# Patient Record
Sex: Male | Born: 1990 | Race: Black or African American | Hispanic: No | Marital: Single | Smoking: Current every day smoker
Health system: Southern US, Community
[De-identification: ages and names within clinical notes are randomized; demographics above are authoritative.]

## PROBLEM LIST (undated history)

## (undated) DIAGNOSIS — S42009A Fracture of unspecified part of unspecified clavicle, initial encounter for closed fracture: Secondary | ICD-10-CM

---
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
## 2016-03-16 ENCOUNTER — Emergency Department (HOSPITAL_COMMUNITY): Payer: Self-pay

## 2016-03-16 ENCOUNTER — Encounter (HOSPITAL_COMMUNITY): Payer: Self-pay | Admitting: Emergency Medicine

## 2016-03-16 ENCOUNTER — Emergency Department (HOSPITAL_COMMUNITY)
Admission: EM | Admit: 2016-03-16 | Discharge: 2016-03-16 | Disposition: A | Discharge status: Home / Self Care | Payer: Self-pay | Attending: Emergency Medicine | Admitting: Emergency Medicine

## 2016-03-16 DIAGNOSIS — F17210 Nicotine dependence, cigarettes, uncomplicated: Secondary | ICD-10-CM | POA: Insufficient documentation

## 2016-03-16 DIAGNOSIS — N50819 Testicular pain, unspecified: Secondary | ICD-10-CM

## 2016-03-16 DIAGNOSIS — R1030 Lower abdominal pain, unspecified: Secondary | ICD-10-CM

## 2016-03-16 DIAGNOSIS — R1031 Right lower quadrant pain: Secondary | ICD-10-CM | POA: Insufficient documentation

## 2016-03-16 DIAGNOSIS — N451 Epididymitis: Secondary | ICD-10-CM

## 2016-03-16 HISTORY — DX: Fracture of unspecified part of unspecified clavicle, initial encounter for closed fracture: S42.009A

## 2016-03-16 LAB — CBC WITH DIFFERENTIAL/PLATELET
BASOS PCT: 0 %
Basophils Absolute: 0 10*3/uL (ref 0.0–0.1)
EOS ABS: 0.1 10*3/uL (ref 0.0–0.7)
EOS PCT: 1 %
HCT: 43.2 % (ref 39.0–52.0)
HEMOGLOBIN: 14.8 g/dL (ref 13.0–17.0)
LYMPHS ABS: 1.8 10*3/uL (ref 0.7–4.0)
Lymphocytes Relative: 9 %
MCH: 29.5 pg (ref 26.0–34.0)
MCHC: 34.3 g/dL (ref 30.0–36.0)
MCV: 86.1 fL (ref 78.0–100.0)
MONOS PCT: 5 %
Monocytes Absolute: 1.2 10*3/uL — ABNORMAL HIGH (ref 0.1–1.0)
NEUTROS PCT: 85 %
Neutro Abs: 18.5 10*3/uL — ABNORMAL HIGH (ref 1.7–7.7)
Platelets: 245 10*3/uL (ref 150–400)
RBC: 5.02 MIL/uL (ref 4.22–5.81)
RDW: 13 % (ref 11.5–15.5)
WBC: 21.6 10*3/uL — AB (ref 4.0–10.5)

## 2016-03-16 LAB — URINALYSIS, ROUTINE W REFLEX MICROSCOPIC
BILIRUBIN URINE: NEGATIVE
Glucose, UA: NEGATIVE mg/dL
HGB URINE DIPSTICK: NEGATIVE
KETONES UR: NEGATIVE mg/dL
Leukocytes, UA: NEGATIVE
NITRITE: NEGATIVE
Protein, ur: NEGATIVE mg/dL
Specific Gravity, Urine: 1.015 (ref 1.005–1.030)
pH: 6.5 (ref 5.0–8.0)

## 2016-03-16 LAB — BASIC METABOLIC PANEL
Anion gap: 7 (ref 5–15)
BUN: 11 mg/dL (ref 6–20)
CHLORIDE: 101 mmol/L (ref 101–111)
CO2: 29 mmol/L (ref 22–32)
Calcium: 8.8 mg/dL — ABNORMAL LOW (ref 8.9–10.3)
Creatinine, Ser: 1.09 mg/dL (ref 0.61–1.24)
GFR calc Af Amer: >60 mL/min (ref >60)
GFR calc non Af Amer: >60 mL/min (ref >60)
GLUCOSE: 89 mg/dL (ref 65–99)
POTASSIUM: 4 mmol/L (ref 3.5–5.1)
Sodium: 137 mmol/L (ref 135–145)

## 2016-03-16 LAB — D-DIMER, QUANTITATIVE: D-Dimer, Quant: 0.45 ug/mL-FEU (ref 0.00–0.50)

## 2016-03-16 LAB — LIPASE, BLOOD: Lipase: 25 U/L (ref 11–51)

## 2016-03-16 LAB — TROPONIN I: Troponin I: <0.03 ng/mL (ref <0.03)

## 2016-03-16 MED ORDER — DOXYCYCLINE HYCLATE 100 MG PO TABS: 100.0000 mg | ORAL_TABLET | Freq: Two times a day (BID) | ORAL | Status: AC

## 2016-03-16 MED ORDER — DOXYCYCLINE HYCLATE 100 MG PO TABS
100.0000 mg | ORAL_TABLET | Freq: Once | ORAL | Status: AC
  Administered 2016-03-16: 100 mg via ORAL
  Filled 2016-03-16: qty 1

## 2016-03-16 MED ORDER — NAPROXEN 250 MG PO TABS: 250.0000 mg | ORAL_TABLET | Freq: Two times a day (BID) | ORAL | Status: AC | PRN

## 2016-03-16 MED ORDER — CEFTRIAXONE SODIUM 250 MG IJ SOLR
250.0000 mg | Freq: Once | INTRAMUSCULAR | Status: AC
  Administered 2016-03-16: 250 mg via INTRAMUSCULAR
  Filled 2016-03-16: qty 250

## 2016-03-16 MED ORDER — LIDOCAINE HCL (PF) 1 % IJ SOLN
INTRAMUSCULAR | Status: AC
  Filled 2016-03-16: qty 5

## 2016-03-16 MED ORDER — HYDROCODONE-ACETAMINOPHEN 5-325 MG PO TABS: ORAL_TABLET | ORAL | Status: AC

## 2016-03-16 MED ORDER — MORPHINE SULFATE (PF) 4 MG/ML IV SOLN
4.0000 mg | Freq: Once | INTRAVENOUS | Status: AC
  Administered 2016-03-16: 4 mg via INTRAMUSCULAR
  Filled 2016-03-16: qty 1

## 2016-03-16 NOTE — ED Notes (Signed)
Pt reports pelvic pain and RT groin pain. Pt states that his RT testicle is larger than the other. States this has happened before and it was just fluid. Pt also reports intermittent CP x 2 weeks. Pt in NAD.

## 2016-03-16 NOTE — Discharge Instructions (Signed)
Take the prescriptions as directed. Wear supportive undergarments. Call the Urologist tomorrow to schedule a follow up appointment within the next 3 days.  Return to the Emergency Department immediately sooner if worsening.

## 2016-03-16 NOTE — ED Provider Notes (Signed)
CSN: [id number]     Arrival date & time 03/16/16  1212 History   First MD Initiated Contact with Patient 03/16/16 1511     Chief Complaint  Patient presents with  . Groin Pain  . Chest Pain     HPI  Pt was seen at 1515. Per pt, c/o gradual onset and worsening of persistent right testicular "pain" and "swelling" for the past 3 days. Pt states his pain started "after I lifted a heavy couch" 3 days ago. Described the pain at that time as "sharp." States his right testicle began to swell yesterday and "throb." States the throbbing radiates into his right groin area. Denies dysuria/hematuria, no rash, no back pain, no abd pain, no N/V/D, no fevers. Pt also c/o gradual onset and persistence of multiple intermittent episodes of left sided chest "pain" for the past 2+ weeks. Pt describes the CP as "sharp," and lasting a few seconds before resolving. Denies any associated symptoms. Denies palpitations, no SOB/cough, no injury.     Past Medical History  Diagnosis Date  . Clavicle fracture    History reviewed. No pertinent past surgical history.  Social History  Substance Use Topics  . Smoking status: Current Every Day Smoker -- 1.00 packs/day    Types: Cigarettes  . Smokeless tobacco: None  . Alcohol Use: Yes     Comment: occ.    Review of Systems ROS: Statement: All systems negative except as marked or noted in the HPI; Constitutional: Negative for fever and chills. ; ; Eyes: Negative for eye pain, redness and discharge. ; ; ENMT: Negative for ear pain, hoarseness, nasal congestion, sinus pressure and sore throat. ; ; Cardiovascular: +CP. Negative for palpitations, diaphoresis, dyspnea and peripheral edema. ; ; Respiratory: Negative for cough, wheezing and stridor. ; ; Gastrointestinal: Negative for nausea, vomiting, diarrhea, abdominal pain, blood in stool, hematemesis, jaundice and rectal bleeding. . ; ; Genitourinary: Negative for dysuria, flank pain and hematuria. ; ; Genital:  No penile  drainage or rash, +right testicular pain and swelling. No scrotal rash. ;; Musculoskeletal: Negative for back pain and neck pain. Negative for swelling and trauma.; ; Skin: Negative for pruritus, rash, abrasions, blisters, bruising and skin lesion.; ; Neuro: Negative for headache, lightheadedness and neck stiffness. Negative for weakness, altered level of consciousness, altered mental status, extremity weakness, paresthesias, involuntary movement, seizure and syncope.      Allergies  Review of patient's allergies indicates no known allergies.  Home Medications   Prior to Admission medications   Not on File   BP 111/86 mmHg  Pulse 103  Temp(Src) 98.6 F (37 C) (Oral)  Resp 18  Ht 5\' 6"  (1.676 m)  Wt 143 lb (64.864 kg)  BMI 23.09 kg/m2  SpO2 100% Physical Exam 1520: Physical examination:  Nursing notes reviewed; Vital signs and O2 SAT reviewed;  Constitutional: Well developed, Well nourished, Well hydrated, In no acute distress; Head:  Normocephalic, atraumatic; Eyes: EOMI, PERRL, No scleral icterus; ENMT: Mouth and pharynx normal, Mucous membranes moist; Neck: Supple, Full range of motion, No lymphadenopathy; Cardiovascular: Regular rate and rhythm, No murmur, rub, or gallop; Respiratory: Breath sounds clear & equal bilaterally, No rales, rhonchi, wheezes.  Speaking full sentences with ease, Normal respiratory effort/excursion; Chest: Nontender, Movement normal; Abdomen: Soft, Nontender, Nondistended, Normal bowel sounds; Genitourinary: No CVA tenderness. Genital exam performed with pt permission and ED RN chaperone present during exam. No perineal erythema.  No penile lesions, +clear drainage; GC/chlam swab obtained and sent to lab. No scrotal  erythema, edema, ecchymosis or lesions.  Normal testicular lie.  +right proximal posterior testicular tenderness to palp.  +cremasteric reflexes bilat.  No inguinal LAN or palpable masses.; Extremities: Pulses normal, No tenderness, No edema, No calf  edema or asymmetry.; Neuro: AA&Ox3, Major CN grossly intact.  Speech clear. No gross focal motor or sensory deficits in extremities.; Skin: Color normal, Warm, Dry.   ED Course  Procedures (including critical care time) Labs Review  Imaging Review  I have personally reviewed and evaluated these images and lab results as part of my medical decision-making.   EKG Interpretation   Date/Time:  Wednesday March 16 2016 12:22:44 EDT Ventricular Rate:  112 PR Interval:  112 QRS Duration: 74 QT Interval:  312 QTC Calculation: 425 R Axis:   96 Text Interpretation:  Sinus tachycardia Rightward axis Nonspecific ST and  T wave abnormality When compared with ECG of 06/21/2008 Nonspecific ST and  T wave abnormality is now Present Confirmed by [facility]  MD, Nicholos JohnsKATHLEEN  [phone]([id number]) on 03/16/2016 3:36:35 PM      MDM  MDM Reviewed: previous chart, nursing note and vitals Reviewed previous: labs and ECG Interpretation: labs, ECG, ultrasound, x-ray and CT scan      Results for orders placed or performed during the hospital encounter of 03/16/16  Urinalysis, Routine w reflex microscopic ([facility])  Result Value Ref Range   Color, Urine YELLOW YELLOW   APPearance CLEAR CLEAR   Specific Gravity, Urine 1.015 1.005 - 1.030   pH 6.5 5.0 - 8.0   Glucose, UA NEGATIVE NEGATIVE mg/dL   Hgb urine dipstick NEGATIVE NEGATIVE   Bilirubin Urine NEGATIVE NEGATIVE   Ketones, ur NEGATIVE NEGATIVE mg/dL   Protein, ur NEGATIVE NEGATIVE mg/dL   Nitrite NEGATIVE NEGATIVE   Leukocytes, UA NEGATIVE NEGATIVE  CBC with Differential  Result Value Ref Range   WBC 21.6 (H) 4.0 - 10.5 K/uL   RBC 5.02 4.22 - 5.81 MIL/uL   Hemoglobin 14.8 13.0 - 17.0 g/dL   HCT 60.443.2 54.039.0 - 98.152.0 %   MCV 86.1 78.0 - 100.0 fL   MCH 29.5 26.0 - 34.0 pg   MCHC 34.3 30.0 - 36.0 g/dL   RDW 19.113.0 47.811.5 - 29.515.5 %   Platelets 245 150 - 400 K/uL   Neutrophils Relative % 85 %   Neutro Abs 18.5 (H) 1.7 - 7.7 K/uL   Lymphocytes Relative 9 %    Lymphs Abs 1.8 0.7 - 4.0 K/uL   Monocytes Relative 5 %   Monocytes Absolute 1.2 (H) 0.1 - 1.0 K/uL   Eosinophils Relative 1 %   Eosinophils Absolute 0.1 0.0 - 0.7 K/uL   Basophils Relative 0 %   Basophils Absolute 0.0 0.0 - 0.1 K/uL  Basic metabolic panel  Result Value Ref Range   Sodium 137 135 - 145 mmol/L   Potassium 4.0 3.5 - 5.1 mmol/L   Chloride 101 101 - 111 mmol/L   CO2 29 22 - 32 mmol/L   Glucose, Bld 89 65 - 99 mg/dL   BUN 11 6 - 20 mg/dL   Creatinine, Ser 6.211.09 0.61 - 1.24 mg/dL   Calcium 8.8 (L) 8.9 - 10.3 mg/dL   GFR calc non Af Amer >60 >60 mL/min   GFR calc Af Amer >60 >60 mL/min   Anion gap 7 5 - 15  Lipase, blood  Result Value Ref Range   Lipase 25 11 - 51 U/L  Troponin I  Result Value Ref Range   Troponin I <0.03 <  0.03 ng/mL  D-dimer, quantitative  Result Value Ref Range   D-Dimer, Quant 0.45 0.00 - 0.50 ug/mL-FEU   Dg Chest 2 View 03/16/2016  CLINICAL DATA:  Intermittent chest pain for 2 weeks. EXAM: CHEST  2 VIEW COMPARISON:  06/21/2008 FINDINGS: The heart size and mediastinal contours are within normal limits. Both lungs are clear. The visualized skeletal structures are unremarkable. IMPRESSION: No active cardiopulmonary disease. Electronically Signed   By: Charlett Nose M.D.   On: 03/16/2016 12:44   US Scrotum 03/16/2016  CLINICAL DATA:  Right testicle pain 2 weeks EXAM: SCROTAL ULTRASOUND DOPPLER ULTRASOUND OF THE TESTICLES TECHNIQUE: Complete ultrasound examination of the testicles, epididymis, and other scrotal structures was performed. Color and spectral Doppler ultrasound were also utilized to evaluate blood flow to the testicles. COMPARISON:  None. FINDINGS: Right testicle Measurements: 5.1 x 3.5 x 2.6 cm. No mass or microlithiasis visualized. Left testicle Measurements: 4.6 x 2.2 x 3.3 cm. No mass or microlithiasis visualized. Right epididymis: Enlarged right epididymis which is hypoechoic and measures 16 mm. Right epididymis is hypoechoic and does not show  hypervascularity on Doppler. Left epididymis:  Normal in size and appearance. Hydrocele:  Small right hydrocele.  None on the left. Varicocele:  None visualized. Pulsed Doppler interrogation of both testes demonstrates normal low resistance arterial and venous waveforms bilaterally. IMPRESSION: Negative for testicular torsion.  No testicular mass Enlargement of the right epididymis without hypervascularity. This may represent epididymitis given the patient's pain in this area however there is no hypervascularity. Urologic evaluation suggested. Small right hydrocele. Electronically Signed   By: Marlan Palau M.D.   On: 03/16/2016 16:22   Ct Renal Stone Study 03/16/2016  CLINICAL DATA:  Right lower quadrant and groin pain starting today. EXAM: CT ABDOMEN AND PELVIS WITHOUT CONTRAST TECHNIQUE: Multidetector CT imaging of the abdomen and pelvis was performed following the standard protocol without IV contrast. COMPARISON:  None. FINDINGS: Lower chest:  Unremarkable. Hepatobiliary: No focal abnormality in the liver on this study without intravenous contrast. No evidence of hepatomegaly. There is no evidence for gallstones, gallbladder wall thickening, or pericholecystic fluid. No intrahepatic or extrahepatic biliary dilation. Pancreas: No focal mass lesion. No dilatation of the main duct. No intraparenchymal cyst. No peripancreatic edema. Spleen: No splenomegaly. No focal mass lesion. Adrenals/Urinary Tract: No adrenal nodule or mass. No stones are seen in either kidney. No secondary change in either kidney. No ureteral or bladder stones. Stomach/Bowel: Stomach is nondistended. No gastric wall thickening. No evidence of outlet obstruction. Duodenum is normally positioned as is the ligament of Treitz. No small bowel wall thickening. No small bowel dilatation. Terminal ileum not visualized. The appendix is not visualized, but there is no edema or inflammation in the region of the cecum. No gross colonic mass. No colonic  wall thickening. No substantial diverticular change. Vascular/Lymphatic: No abdominal aortic aneurysm. No abdominal aortic atherosclerotic calcification. There is no gastrohepatic or hepatoduodenal ligament lymphadenopathy. No intraperitoneal or retroperitoneal lymphadenopathy. No pelvic sidewall lymphadenopathy. Reproductive: The prostate gland and seminal vesicles have normal imaging features. Other: No intraperitoneal free fluid. Musculoskeletal: No evidence for inguinal hernia. Bone windows reveal no worrisome lytic or sclerotic osseous lesions. IMPRESSION: Unremarkable noncontrast CT exam of the abdomen and pelvis. Specifically, no features to explain the patient's history of right lower quadrant and groin pain. Electronically Signed   By: Kennith Center M.D.   On: 03/16/2016 18:06     1645:  T/C to Uro Dr. Ronne Binning, case discussed, including:  HPI, pertinent PM/SHx, VS/PE,  dx testing, ED course and treatment:  tx for epididymitis (rocephin, doxy x14d), f/u office.  1850:  Feels better after meds. Wants to go home now. Dx and testing, as well as d/w Uro MD, d/w pt.  Questions answered.  Verb understanding, agreeable to d/c home with outpt f/u.     Samuel Jester, DO 03/20/16 1407

## 2016-03-17 LAB — GC/CHLAMYDIA PROBE AMP (~~LOC~~) NOT AT ARMC
Chlamydia: NEGATIVE
NEISSERIA GONORRHEA: NEGATIVE

## 2016-03-18 LAB — URINE CULTURE

## 2016-12-19 IMAGING — US US ART/VEN ABD/PELV/SCROTUM DOPPLER LTD
1 series · 13 of 25 positions shown · non-contrast
Comparison: None.

CLINICAL DATA: Right testicle pain 2 weeks

EXAM:
SCROTAL ULTRASOUND
DOPPLER ULTRASOUND OF THE TESTICLES
TECHNIQUE: Complete ultrasound examination of the testicles, epididymis, and
other scrotal structures was performed. Color and spectral Doppler
ultrasound were also utilized to evaluate blood flow to the
testicles.

[Series 1: us art/ven abd/pelv/scrotum doppler ltd · 0.08mm/px · 13 of 68 slices shown]
[im 1/68]
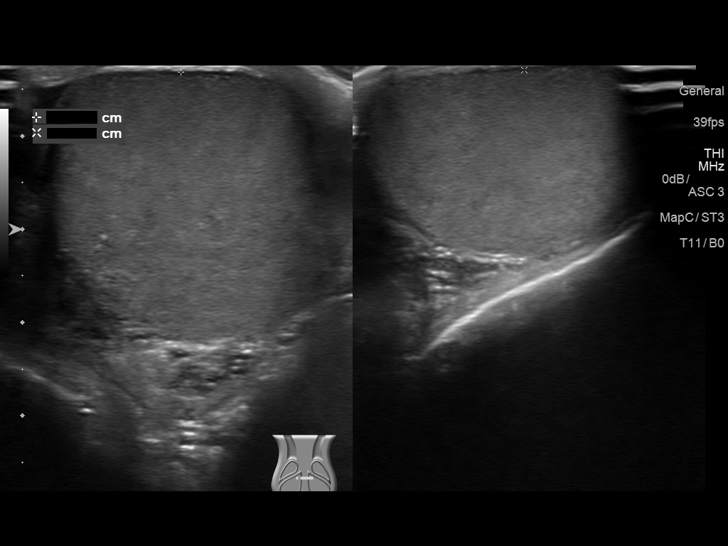
[im 6/68]
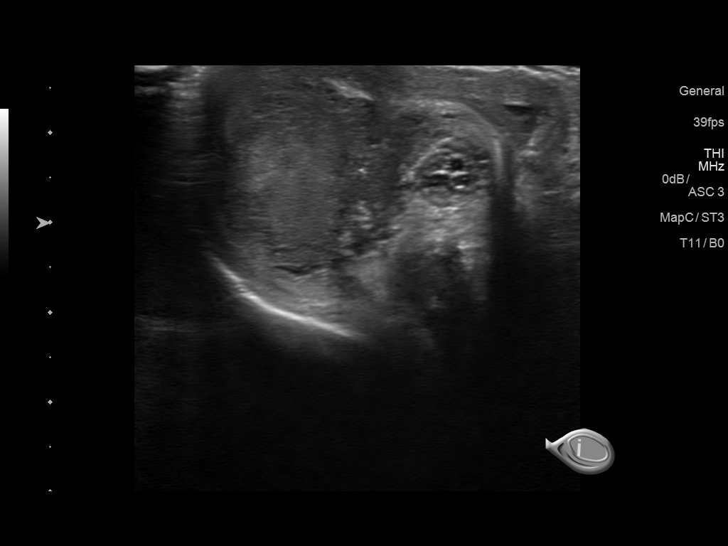
[im 12/68]
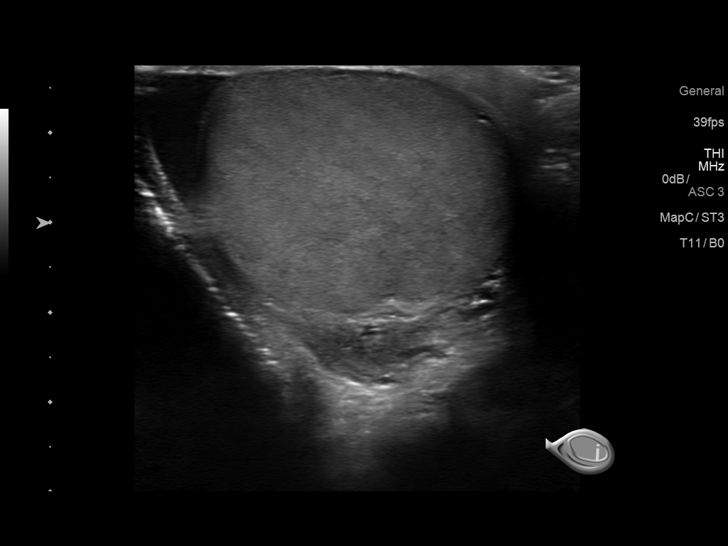
[im 17/68]
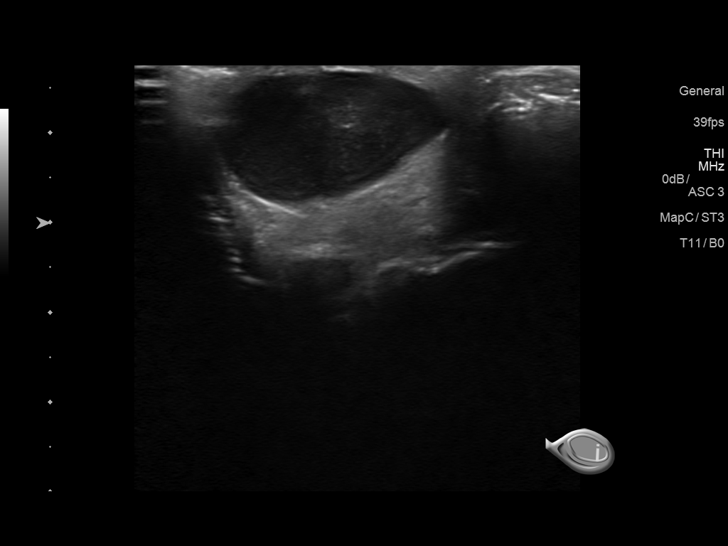
[im 23/68]
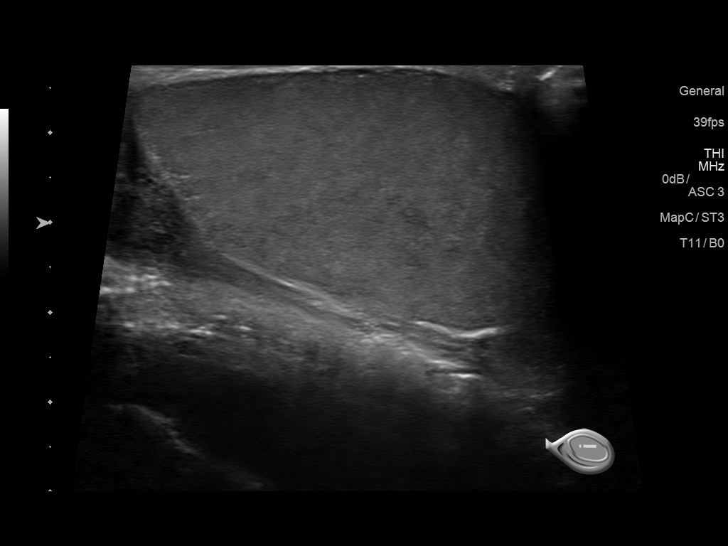
[im 28/68]
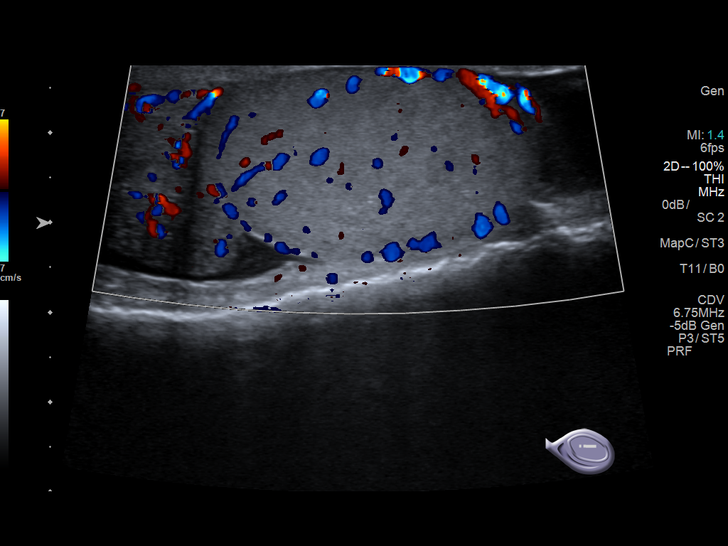
[im 34/68]
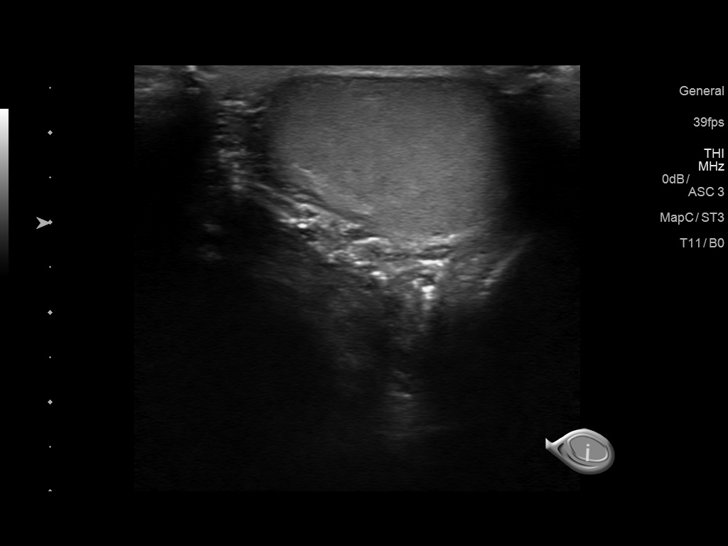
[im 40/68]
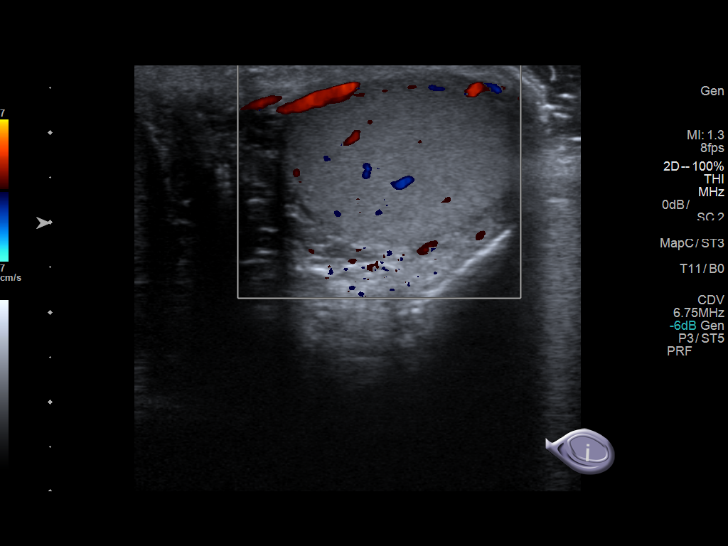
[im 45/68]
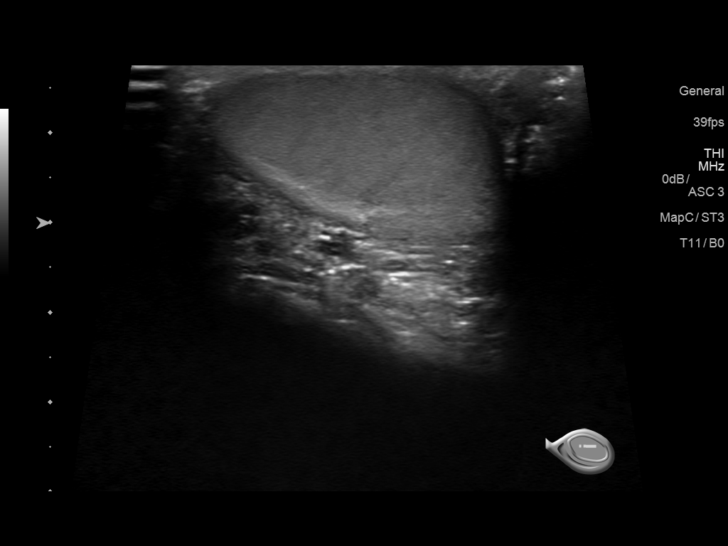
[im 51/68]
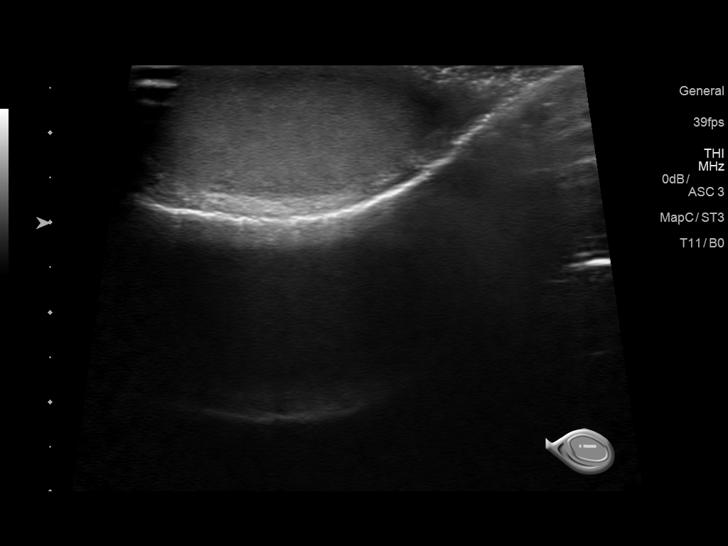
[im 56/68]
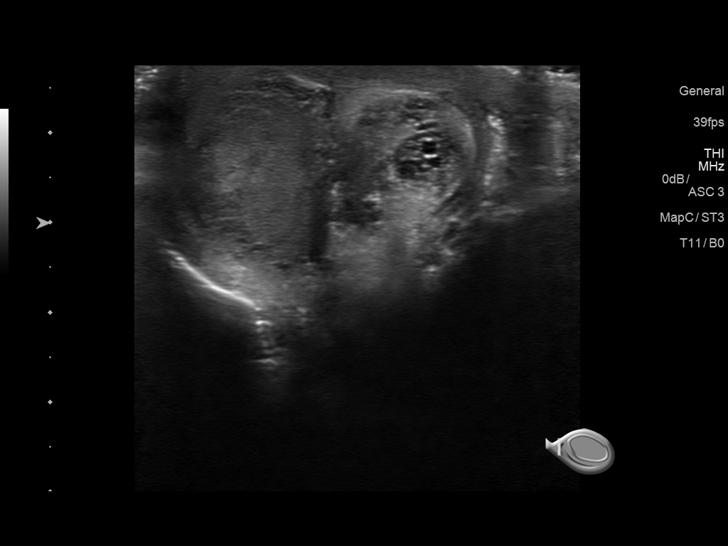
[im 62/68]
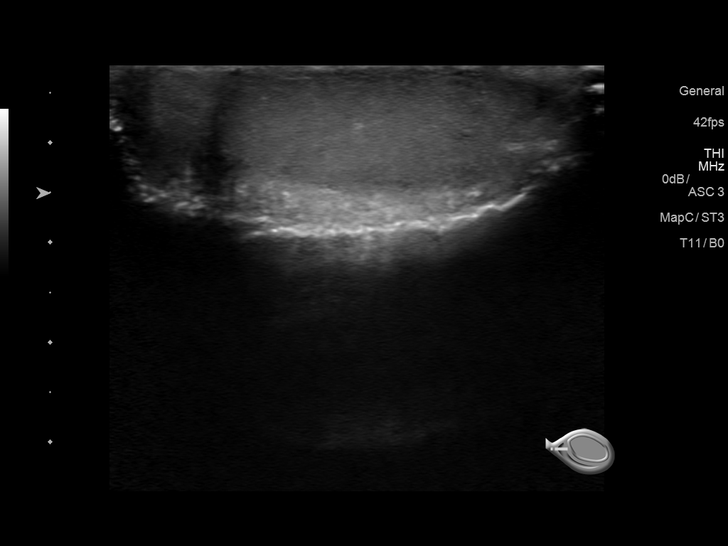
[im 68/68]
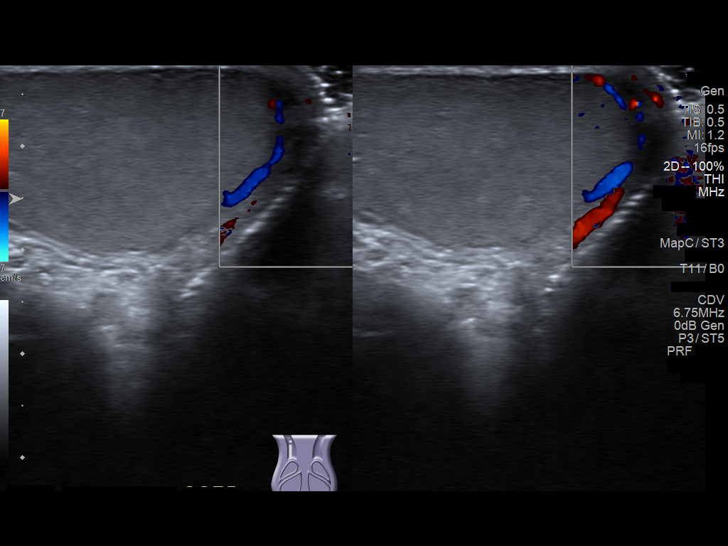

[13 of 25 positions shown; findings below may reference images not displayed]

FINDINGS: Right testicle

Measurements: 5.1 x 3.5 x 2.6 cm. No mass or microlithiasis
visualized.

Left testicle

Measurements: 4.6 x 2.2 x 3.3 cm. No mass or microlithiasis
visualized.

Right epididymis: Enlarged right epididymis which is hypoechoic and
measures 16 mm. Right epididymis is hypoechoic and does not show
hypervascularity on Doppler.

Left epididymis:  Normal in size and appearance.

Hydrocele:  Small right hydrocele.  None on the left.

Varicocele:  None visualized.

Pulsed Doppler interrogation of both testes demonstrates normal low
resistance arterial and venous waveforms bilaterally.
IMPRESSION: Negative for testicular torsion.  No testicular mass

Enlargement of the right epididymis without hypervascularity. This
may represent epididymitis given the patient's pain in this area
however there is no hypervascularity. Urologic evaluation suggested.
Small right hydrocele.
